# Patient Record
Sex: Male | Born: 1985 | Race: Black or African American | Hispanic: No | Marital: Married | Smoking: Never smoker
Health system: Southern US, Community
[De-identification: ages and names within clinical notes are randomized; demographics above are authoritative.]

## PROBLEM LIST (undated history)

## (undated) DIAGNOSIS — J302 Other seasonal allergic rhinitis: Secondary | ICD-10-CM

## (undated) HISTORY — PX: WISDOM TOOTH EXTRACTION: SHX21

---
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
## 2008-11-01 ENCOUNTER — Emergency Department (HOSPITAL_COMMUNITY): Admission: EM | Admit: 2008-11-01 | Discharge: 2008-11-01 | Payer: Self-pay | Admitting: Emergency Medicine

## 2010-08-24 LAB — HEPATITIS B SURFACE ANTIGEN: Hepatitis B Surface Ag: NEGATIVE

## 2010-08-24 LAB — HIV ANTIBODY (ROUTINE TESTING W REFLEX): HIV: NONREACTIVE

## 2010-08-24 LAB — HSV 1 ANTIBODY, IGG: HSV 1 Glycoprotein G Ab, IgG: 0.19 IV

## 2010-08-24 LAB — RPR: RPR Ser Ql: NONREACTIVE

## 2010-08-24 LAB — HSV 2 ANTIBODY, IGG: HSV 2 Glycoprotein G Ab, IgG: 0.31 IV

## 2014-09-07 ENCOUNTER — Emergency Department (INDEPENDENT_AMBULATORY_CARE_PROVIDER_SITE_OTHER)
Admission: EM | Admit: 2014-09-07 | Discharge: 2014-09-07 | Disposition: A | Discharge status: Home / Self Care | Payer: BC Managed Care – PPO | Source: Home / Self Care | Attending: Family Medicine | Admitting: Family Medicine

## 2014-09-07 ENCOUNTER — Encounter (HOSPITAL_COMMUNITY): Payer: Self-pay | Admitting: Emergency Medicine

## 2014-09-07 DIAGNOSIS — L089 Local infection of the skin and subcutaneous tissue, unspecified: Secondary | ICD-10-CM | POA: Diagnosis not present

## 2014-09-07 MED ORDER — SULFAMETHOXAZOLE-TRIMETHOPRIM 800-160 MG PO TABS: 2.0000 | ORAL_TABLET | Freq: Two times a day (BID) | ORAL | Status: DC

## 2014-09-07 NOTE — Discharge Instructions (Signed)
Thank you for coming in today. ° °Cellulitis °Cellulitis is an infection of the skin and the tissue beneath it. The infected area is usually red and tender. Cellulitis occurs most often in the arms and lower legs.  °CAUSES  °Cellulitis is caused by bacteria that enter the skin through cracks or cuts in the skin. The most common types of bacteria that cause cellulitis are staphylococci and streptococci. °SIGNS AND SYMPTOMS  °· Redness and warmth. °· Swelling. °· Tenderness or pain. °· Fever. °DIAGNOSIS  °Your health care provider can usually determine what is wrong based on a physical exam. Blood tests may also be done. °TREATMENT  °Treatment usually involves taking an antibiotic medicine. °HOME CARE INSTRUCTIONS  °· Take your antibiotic medicine as directed by your health care provider. Finish the antibiotic even if you start to feel better. °· Keep the infected arm or leg elevated to reduce swelling. °· Apply a warm cloth to the affected area up to 4 times per day to relieve pain. °· Take medicines only as directed by your health care provider. °· Keep all follow-up visits as directed by your health care provider. °SEEK MEDICAL CARE IF:  °· You notice red streaks coming from the infected area. °· Your red area gets larger or turns dark in color. °· Your bone or joint underneath the infected area becomes painful after the skin has healed. °· Your infection returns in the same area or another area. °· You notice a swollen bump in the infected area. °· You develop new symptoms. °· You have a fever. °SEEK IMMEDIATE MEDICAL CARE IF:  °· You feel very sleepy. °· You develop vomiting or diarrhea. °· You have a general ill feeling (malaise) with muscle aches and pains. °MAKE SURE YOU:  °· Understand these instructions. °· Will watch your condition. °· Will get help right away if you are not doing well or get worse. °Document Released: 02/10/2005 Document Revised: 09/17/2013 Document Reviewed: 07/19/2011 °[employer]® Patient  Information ©2015 [employer]. This information is not intended to replace advice given to you by your health care provider. Make sure you discuss any questions you have with your health care provider. ° °

## 2014-09-07 NOTE — ED Provider Notes (Signed)
Randall Clayton is a 29 y.o. male who presents to Urgent Care today for skin infection. Patient is a painful bump behind his right ear that has been present for the last week or so. She has tried to express pus which has not happened yet. Symptoms are mild. No fevers or chills vomiting or diarrhea. No treatments tried yet.   History reviewed. No pertinent past medical history. History reviewed. No pertinent past surgical history. History  Substance Use Topics  . Smoking status: Never Smoker   . Smokeless tobacco: Not on file  . Alcohol Use: No   ROS as above Medications: No current facility-administered medications for this encounter.   Current Outpatient Prescriptions  Medication Sig Dispense Refill  . sulfamethoxazole-trimethoprim (BACTRIM DS,SEPTRA DS) 800-160 MG per tablet Take 2 tablets by mouth 2 (two) times daily. 28 tablet 0   No Known Allergies   Exam:  BP 168/95 mmHg  Pulse 72  Temp(Src) 97.8 F (36.6 C) (Oral)  Resp 16  SpO2 100% Gen: Well NAD HEENT: EOMI,  MMM skin behind right ear with inflamed erythematous skin tag. No fluctuance or surrounding erythema Lungs: Normal work of breathing. CTABL Heart: RRR no MRG Abd: NABS, Soft. Nondistended, Nontender Exts: Brisk capillary refill, warm and well perfused.   No results found for this or any previous visit (from the past 24 hour(s)). No results found.  Assessment and Plan: 29 y.o. male with infected skin tag. Bactrim return as needed  Discussed warning signs or symptoms. Please see discharge instructions. Patient expresses understanding.     Rodolph BongEvan S Korrine Sicard, MD 09/07/14 1900

## 2014-09-07 NOTE — ED Notes (Signed)
Reports boil behind right ear x several years.  Mild swelling and pain.

## 2017-04-12 ENCOUNTER — Encounter (HOSPITAL_COMMUNITY): Payer: Self-pay | Admitting: Emergency Medicine

## 2017-04-12 ENCOUNTER — Other Ambulatory Visit: Payer: Self-pay

## 2017-04-12 ENCOUNTER — Ambulatory Visit (HOSPITAL_COMMUNITY)
Admission: EM | Admit: 2017-04-12 | Discharge: 2017-04-12 | Disposition: A | Discharge status: Home / Self Care | Payer: BC Managed Care – PPO | Attending: Family Medicine | Admitting: Family Medicine

## 2017-04-12 DIAGNOSIS — Z113 Encounter for screening for infections with a predominantly sexual mode of transmission: Secondary | ICD-10-CM

## 2017-04-12 DIAGNOSIS — Z202 Contact with and (suspected) exposure to infections with a predominantly sexual mode of transmission: Secondary | ICD-10-CM | POA: Insufficient documentation
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
## 2017-04-13 LAB — URINE CYTOLOGY ANCILLARY ONLY
CHLAMYDIA, DNA PROBE: NEGATIVE
Neisseria Gonorrhea: NEGATIVE
TRICH (WINDOWPATH): NEGATIVE
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
## 2019-12-27 ENCOUNTER — Other Ambulatory Visit: Payer: Self-pay

## 2019-12-27 DIAGNOSIS — R109 Unspecified abdominal pain: Secondary | ICD-10-CM | POA: Diagnosis not present

## 2019-12-27 DIAGNOSIS — S6991XA Unspecified injury of right wrist, hand and finger(s), initial encounter: Secondary | ICD-10-CM | POA: Diagnosis present

## 2019-12-27 DIAGNOSIS — Y998 Other external cause status: Secondary | ICD-10-CM | POA: Diagnosis not present

## 2019-12-27 DIAGNOSIS — S52571A Other intraarticular fracture of lower end of right radius, initial encounter for closed fracture: Secondary | ICD-10-CM | POA: Insufficient documentation

## 2019-12-27 DIAGNOSIS — Y92481 Parking lot as the place of occurrence of the external cause: Secondary | ICD-10-CM | POA: Insufficient documentation

## 2019-12-27 DIAGNOSIS — S62322A Displaced fracture of shaft of third metacarpal bone, right hand, initial encounter for closed fracture: Secondary | ICD-10-CM | POA: Diagnosis not present

## 2019-12-27 DIAGNOSIS — S62336A Displaced fracture of neck of fifth metacarpal bone, right hand, initial encounter for closed fracture: Secondary | ICD-10-CM | POA: Insufficient documentation

## 2019-12-27 DIAGNOSIS — Y9389 Activity, other specified: Secondary | ICD-10-CM | POA: Insufficient documentation

## 2019-12-28 ENCOUNTER — Emergency Department (HOSPITAL_COMMUNITY): Payer: BC Managed Care – PPO

## 2019-12-28 ENCOUNTER — Encounter (HOSPITAL_COMMUNITY): Payer: Self-pay | Admitting: Emergency Medicine

## 2019-12-28 ENCOUNTER — Emergency Department (HOSPITAL_COMMUNITY)
Admission: EM | Admit: 2019-12-28 | Discharge: 2019-12-28 | Disposition: A | Discharge status: Home / Self Care | Payer: BC Managed Care – PPO | Attending: Emergency Medicine | Admitting: Emergency Medicine

## 2019-12-28 DIAGNOSIS — S52571A Other intraarticular fracture of lower end of right radius, initial encounter for closed fracture: Secondary | ICD-10-CM

## 2019-12-28 DIAGNOSIS — S62336A Displaced fracture of neck of fifth metacarpal bone, right hand, initial encounter for closed fracture: Secondary | ICD-10-CM

## 2019-12-28 DIAGNOSIS — S62322A Displaced fracture of shaft of third metacarpal bone, right hand, initial encounter for closed fracture: Secondary | ICD-10-CM

## 2019-12-28 MED ORDER — SODIUM CHLORIDE (PF) 0.9 % IJ SOLN
INTRAMUSCULAR | Status: AC
  Filled 2019-12-28: qty 50

## 2019-12-28 MED ORDER — ACETAMINOPHEN 500 MG PO TABS
1000.0000 mg | ORAL_TABLET | ORAL | Status: AC
  Administered 2019-12-28: 1000 mg via ORAL
  Filled 2019-12-28: qty 2

## 2019-12-28 MED ORDER — OXYCODONE-ACETAMINOPHEN 5-325 MG PO TABS: 2.0000 | ORAL_TABLET | Freq: Once | ORAL | Status: DC

## 2019-12-28 MED ORDER — OXYCODONE-ACETAMINOPHEN 5-325 MG PO TABS: 1.0000 | ORAL_TABLET | ORAL | 0 refills | Status: DC | PRN

## 2019-12-28 MED ORDER — IOHEXOL 300 MG/ML  SOLN: 100.0000 mL | Freq: Once | INTRAMUSCULAR | Status: DC | PRN

## 2019-12-28 NOTE — Discharge Instructions (Signed)
Take the pain medication as prescribed and follow-up with a hand doctor. Keep elevated. Apply ice. Do not apply any weight to the right arm. Return to the ED with worsening pain, numbness, tingling, weakness, any other concerns.
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
## 2019-12-28 NOTE — Progress Notes (Signed)
Orthopedic Tech Progress Note Patient Details:  [facility] 09-Mar-1986 [id number]  Ortho Devices Type of Ortho Device: Ulna gutter splint Ortho Device/Splint Location: RUE Ortho Device/Splint Interventions: Application   Post Interventions Patient Tolerated: Fair Instructions Provided: Care of device   [facility] E [facility] 12/28/2019, 6:55 AM
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
## 2019-12-31 ENCOUNTER — Other Ambulatory Visit: Payer: Self-pay

## 2019-12-31 ENCOUNTER — Encounter (HOSPITAL_BASED_OUTPATIENT_CLINIC_OR_DEPARTMENT_OTHER): Payer: Self-pay | Admitting: Orthopaedic Surgery

## 2020-01-01 ENCOUNTER — Other Ambulatory Visit (HOSPITAL_COMMUNITY)
Admission: RE | Admit: 2020-01-01 | Discharge: 2020-01-01 | Disposition: A | Discharge status: Home / Self Care | Payer: BC Managed Care – PPO | Source: Ambulatory Visit | Attending: Orthopaedic Surgery | Admitting: Orthopaedic Surgery

## 2020-01-01 DIAGNOSIS — U071 COVID-19: Secondary | ICD-10-CM | POA: Diagnosis not present

## 2020-01-01 DIAGNOSIS — Z01812 Encounter for preprocedural laboratory examination: Secondary | ICD-10-CM | POA: Diagnosis present

## 2020-01-01 LAB — SARS CORONAVIRUS 2 (TAT 6-24 HRS): SARS Coronavirus 2: POSITIVE — AB

## 2020-01-02 NOTE — Progress Notes (Signed)
Called Erica from Dr. Hinda Glatter office with + covid results for this pt. The pt is to have surgery at the [facility] on Thurs 01/03/20 at 0730. These are the current guidelines:  Positive Results for:  Asymptomatic: Procedure postponed and quarantine for 10 days. Symptomatic: Procedure postponed and quarantine for 14 days. Immunocompromised: Procedure postponed and quarantine for 20 days. Hospitalized with Covid: postponed and quarantine for 21 days.  The pt will not be retested for 90 days from the + result.

## 2020-01-02 NOTE — Progress Notes (Signed)
TC to patient. Patient instructed to arrive at 0600, stay in vehicle and call [phone] and ask for Ginger. She will go to the car and bring patient directly back to the short stay bay. Patient states he has only had allergy like symptoms.

## 2020-01-03 ENCOUNTER — Ambulatory Visit (HOSPITAL_COMMUNITY)
Admission: RE | Admit: 2020-01-03 | Discharge: 2020-01-03 | Disposition: A | Discharge status: Home / Self Care | Payer: BC Managed Care – PPO | Attending: Orthopaedic Surgery | Admitting: Orthopaedic Surgery

## 2020-01-03 ENCOUNTER — Ambulatory Visit (HOSPITAL_COMMUNITY): Payer: BC Managed Care – PPO | Admitting: Certified Registered Nurse Anesthetist

## 2020-01-03 ENCOUNTER — Other Ambulatory Visit: Payer: Self-pay

## 2020-01-03 ENCOUNTER — Ambulatory Visit (HOSPITAL_COMMUNITY): Payer: BC Managed Care – PPO

## 2020-01-03 ENCOUNTER — Encounter (HOSPITAL_COMMUNITY)
Admission: RE | Disposition: A | Discharge status: Home / Self Care | Payer: Self-pay | Source: Home / Self Care | Attending: Orthopaedic Surgery

## 2020-01-03 ENCOUNTER — Encounter (HOSPITAL_COMMUNITY): Payer: Self-pay | Admitting: Orthopaedic Surgery

## 2020-01-03 DIAGNOSIS — S62322A Displaced fracture of shaft of third metacarpal bone, right hand, initial encounter for closed fracture: Secondary | ICD-10-CM | POA: Diagnosis present

## 2020-01-03 DIAGNOSIS — S52511A Displaced fracture of right radial styloid process, initial encounter for closed fracture: Secondary | ICD-10-CM | POA: Insufficient documentation

## 2020-01-03 DIAGNOSIS — S62396A Other fracture of fifth metacarpal bone, right hand, initial encounter for closed fracture: Secondary | ICD-10-CM | POA: Diagnosis not present

## 2020-01-03 DIAGNOSIS — U071 COVID-19: Secondary | ICD-10-CM | POA: Diagnosis not present

## 2020-01-03 DIAGNOSIS — Z79899 Other long term (current) drug therapy: Secondary | ICD-10-CM | POA: Diagnosis not present

## 2020-01-03 HISTORY — DX: Other seasonal allergic rhinitis: J30.2

## 2020-01-03 HISTORY — PX: ORIF WRIST FRACTURE: SHX2133

## 2020-01-03 LAB — POCT I-STAT, CHEM 8
BUN: 15 mg/dL (ref 6–20)
Calcium, Ion: 1.14 mmol/L — ABNORMAL LOW (ref 1.15–1.40)
Chloride: 105 mmol/L (ref 98–111)
Creatinine, Ser: 0.9 mg/dL (ref 0.61–1.24)
Glucose, Bld: 104 mg/dL — ABNORMAL HIGH (ref 70–99)
HCT: 47 % (ref 39.0–52.0)
Hemoglobin: 16 g/dL (ref 13.0–17.0)
Potassium: 4 mmol/L (ref 3.5–5.1)
Sodium: 142 mmol/L (ref 135–145)
TCO2: 25 mmol/L (ref 22–32)

## 2020-01-03 LAB — MRSA PCR SCREENING: MRSA by PCR: NEGATIVE

## 2020-01-03 SURGERY — OPEN REDUCTION INTERNAL FIXATION (ORIF) WRIST FRACTURE
Anesthesia: General | Site: Wrist | Laterality: Right

## 2020-01-03 MED ORDER — PROPOFOL 10 MG/ML IV BOLUS
INTRAVENOUS | Status: DC | PRN
  Administered 2020-01-03: 180 mg via INTRAVENOUS

## 2020-01-03 MED ORDER — OXYCODONE HCL 5 MG/5ML PO SOLN: 5.0000 mg | Freq: Once | ORAL | Status: DC | PRN

## 2020-01-03 MED ORDER — LACTATED RINGERS IV SOLN: INTRAVENOUS | Status: DC

## 2020-01-03 MED ORDER — SUCCINYLCHOLINE CHLORIDE 20 MG/ML IJ SOLN
INTRAMUSCULAR | Status: DC | PRN
  Administered 2020-01-03: 180 mg via INTRAVENOUS

## 2020-01-03 MED ORDER — PHENYLEPHRINE HCL-NACL 10-0.9 MG/250ML-% IV SOLN
INTRAVENOUS | Status: DC | PRN
  Administered 2020-01-03: 25 ug/min via INTRAVENOUS

## 2020-01-03 MED ORDER — DEXMEDETOMIDINE (PRECEDEX) IN NS 20 MCG/5ML (4 MCG/ML) IV SYRINGE
PREFILLED_SYRINGE | INTRAVENOUS | Status: DC | PRN
  Administered 2020-01-03: 4 ug via INTRAVENOUS
  Administered 2020-01-03: 8 ug via INTRAVENOUS
  Administered 2020-01-03: 20 ug via INTRAVENOUS
  Administered 2020-01-03: 8 ug via INTRAVENOUS

## 2020-01-03 MED ORDER — MIDAZOLAM HCL 2 MG/2ML IJ SOLN
INTRAMUSCULAR | Status: AC
  Filled 2020-01-03: qty 2

## 2020-01-03 MED ORDER — FENTANYL CITRATE (PF) 250 MCG/5ML IJ SOLN
INTRAMUSCULAR | Status: AC
  Filled 2020-01-03: qty 5

## 2020-01-03 MED ORDER — CHLORHEXIDINE GLUCONATE 4 % EX LIQD: 60.0000 mL | Freq: Once | CUTANEOUS | Status: DC

## 2020-01-03 MED ORDER — LACTATED RINGERS IV SOLN: INTRAVENOUS | Status: DC | PRN

## 2020-01-03 MED ORDER — 0.9 % SODIUM CHLORIDE (POUR BTL) OPTIME
TOPICAL | Status: DC | PRN
  Administered 2020-01-03: 1000 mL

## 2020-01-03 MED ORDER — ONDANSETRON HCL 4 MG/2ML IJ SOLN
INTRAMUSCULAR | Status: DC | PRN
  Administered 2020-01-03: 4 mg via INTRAVENOUS

## 2020-01-03 MED ORDER — FENTANYL CITRATE (PF) 100 MCG/2ML IJ SOLN: 25.0000 ug | INTRAMUSCULAR | Status: DC | PRN

## 2020-01-03 MED ORDER — CEFAZOLIN SODIUM-DEXTROSE 2-4 GM/100ML-% IV SOLN
2.0000 g | INTRAVENOUS | Status: AC
  Administered 2020-01-03: 2 g via INTRAVENOUS

## 2020-01-03 MED ORDER — FENTANYL CITRATE (PF) 250 MCG/5ML IJ SOLN
INTRAMUSCULAR | Status: DC | PRN
  Administered 2020-01-03: 150 ug via INTRAVENOUS

## 2020-01-03 MED ORDER — BUPIVACAINE HCL (PF) 0.25 % IJ SOLN
INTRAMUSCULAR | Status: AC
  Filled 2020-01-03: qty 30

## 2020-01-03 MED ORDER — OXYCODONE HCL 5 MG PO TABS: 5.0000 mg | ORAL_TABLET | Freq: Once | ORAL | Status: DC | PRN

## 2020-01-03 MED ORDER — LIDOCAINE HCL (CARDIAC) PF 100 MG/5ML IV SOSY
PREFILLED_SYRINGE | INTRAVENOUS | Status: DC | PRN
  Administered 2020-01-03: 50 mg via INTRAVENOUS

## 2020-01-03 MED ORDER — PROPOFOL 500 MG/50ML IV EMUL
INTRAVENOUS | Status: DC | PRN
  Administered 2020-01-03: 50 ug/kg/min via INTRAVENOUS

## 2020-01-03 MED ORDER — PROPOFOL 10 MG/ML IV BOLUS
INTRAVENOUS | Status: AC
  Filled 2020-01-03: qty 40

## 2020-01-03 MED ORDER — PHENYLEPHRINE HCL (PRESSORS) 10 MG/ML IV SOLN
INTRAVENOUS | Status: DC | PRN
  Administered 2020-01-03: 120 ug via INTRAVENOUS
  Administered 2020-01-03: 80 ug via INTRAVENOUS

## 2020-01-03 MED ORDER — POVIDONE-IODINE 10 % EX SWAB: 2.0000 "application " | Freq: Once | CUTANEOUS | Status: DC

## 2020-01-03 MED ORDER — MIDAZOLAM HCL 5 MG/5ML IJ SOLN
INTRAMUSCULAR | Status: DC | PRN
  Administered 2020-01-03: 2 mg via INTRAVENOUS
  Administered 2020-01-03: 1 mg via INTRAVENOUS

## 2020-01-03 MED ORDER — ONDANSETRON HCL 4 MG/2ML IJ SOLN: 4.0000 mg | Freq: Once | INTRAMUSCULAR | Status: DC | PRN

## 2020-01-03 SURGICAL SUPPLY — 53 items
ALCOHOL 70% 16 OZ (MISCELLANEOUS) IMPLANT
BIT DRILL LNG QR ACUTRAK 2 (BIT) ×3 IMPLANT
BNDG ELASTIC 3X5.8 VLCR STR LF (GAUZE/BANDAGES/DRESSINGS) ×3 IMPLANT
BNDG ELASTIC 4X5.8 VLCR STR LF (GAUZE/BANDAGES/DRESSINGS) ×3 IMPLANT
BNDG ESMARK 4X9 LF (GAUZE/BANDAGES/DRESSINGS) ×3 IMPLANT
BNDG GAUZE ELAST 4 BULKY (GAUZE/BANDAGES/DRESSINGS) IMPLANT
CANISTER SUCT 3000ML PPV (MISCELLANEOUS) ×3 IMPLANT
CAP PIN PROTECTOR ORTHO WHT (CAP) ×3 IMPLANT
CHLORAPREP W/TINT 26 (MISCELLANEOUS) ×3 IMPLANT
CORD BIPOLAR FORCEPS 12FT (ELECTRODE) ×3 IMPLANT
COVER SURGICAL LIGHT HANDLE (MISCELLANEOUS) ×3 IMPLANT
COVER WAND RF STERILE (DRAPES) IMPLANT
CUFF TOURN SGL QUICK 18X4 (TOURNIQUET CUFF) ×3 IMPLANT
CUFF TOURN SGL QUICK 24 (TOURNIQUET CUFF)
CUFF TRNQT CYL 24X4X16.5-23 (TOURNIQUET CUFF) IMPLANT
DRAPE OEC MINIVIEW 54X84 (DRAPES) ×3 IMPLANT
DRAPE SURG 17X23 STRL (DRAPES) ×3 IMPLANT
DRSG XEROFORM 1X8 (GAUZE/BANDAGES/DRESSINGS) ×3 IMPLANT
GAUZE SPONGE 4X4 12PLY STRL (GAUZE/BANDAGES/DRESSINGS) IMPLANT
GAUZE SPONGE 4X4 12PLY STRL LF (GAUZE/BANDAGES/DRESSINGS) ×3 IMPLANT
GAUZE XEROFORM 1X8 LF (GAUZE/BANDAGES/DRESSINGS) IMPLANT
GLOVE INDICATOR 8.0 STRL GRN (GLOVE) ×3 IMPLANT
GLOVE SURG SYN 7.5  E (GLOVE) ×3
GLOVE SURG SYN 7.5 E (GLOVE) ×1 IMPLANT
GOWN STRL REUS W/ TWL LRG LVL3 (GOWN DISPOSABLE) ×3 IMPLANT
GOWN STRL REUS W/TWL LRG LVL3 (GOWN DISPOSABLE) ×9
GUIDEWIRE 0.054X7 ×12 IMPLANT
GUIDEWIRE ORTHO 0.054X7 SS (WIRE) ×12 IMPLANT
KIT BASIN OR (CUSTOM PROCEDURE TRAY) ×3 IMPLANT
KIT INNATE INSTRUMENT FOR 4.5 (INSTRUMENTS) ×3 IMPLANT
KIT TURNOVER KIT B (KITS) ×3 IMPLANT
NAIL IM THRD INNATE 4.5X50 (Nail) ×3 IMPLANT
NEEDLE 22X1 1/2 (OR ONLY) (NEEDLE) IMPLANT
NS IRRIG 1000ML POUR BTL (IV SOLUTION) ×3 IMPLANT
PACK ORTHO EXTREMITY (CUSTOM PROCEDURE TRAY) ×3 IMPLANT
PAD ARMBOARD 7.5X6 YLW CONV (MISCELLANEOUS) ×6 IMPLANT
PAD CAST 3X4 CTTN HI CHSV (CAST SUPPLIES) ×1 IMPLANT
PAD CAST 4YDX4 CTTN HI CHSV (CAST SUPPLIES) ×1 IMPLANT
PADDING CAST COTTON 3X4 STRL (CAST SUPPLIES) ×3
PADDING CAST COTTON 4X4 STRL (CAST SUPPLIES) ×3
SCREW ACUTRAK 2 STD 30MM (Screw) ×3 IMPLANT
SLING ARM FOAM STRAP XLG (SOFTGOODS) ×3 IMPLANT
SOL PREP POV-IOD 4OZ 10% (MISCELLANEOUS) ×3 IMPLANT
SOL PREP PROV IODINE SCRUB 4OZ (MISCELLANEOUS) ×3 IMPLANT
SPLINT FIBERGLASS 4X15 (CAST SUPPLIES) ×6 IMPLANT
SUT VIC AB 3-0 PS2 18 (SUTURE) IMPLANT
SYR CONTROL 10ML LL (SYRINGE) IMPLANT
TOWEL GREEN STERILE (TOWEL DISPOSABLE) ×3 IMPLANT
TOWEL GREEN STERILE FF (TOWEL DISPOSABLE) ×3 IMPLANT
TUBE CONNECTING 12'X1/4 (SUCTIONS) ×1
TUBE CONNECTING 12X1/4 (SUCTIONS) ×2 IMPLANT
UNDERPAD 30X36 HEAVY ABSORB (UNDERPADS AND DIAPERS) ×3 IMPLANT
WATER STERILE IRR 1000ML POUR (IV SOLUTION) ×3 IMPLANT

## 2020-01-03 NOTE — Progress Notes (Signed)
Orthopedic Tech Progress Note Patient Details:  [facility] 12/20/1985 [id number] Dropped off sling at front desk of PACU Ortho Devices Type of Ortho Device: Sling immobilizer Ortho Device/Splint Interventions: Ordered       [name] 01/03/2020, 10:27 AM
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
## 2020-01-03 NOTE — Transfer of Care (Signed)
Immediate Anesthesia Transfer of Care Note  Patient: Randall Clayton  Procedure(s) Performed: Right distal radius fracture open reduction and internal fixation, third metacarpal open reduction and internal fixation, fifth metacarpal head open reduction and internal fixation and surgery as indicated (Right Wrist)  Patient Location: OR 6  Anesthesia Type:General  Level of Consciousness: awake, alert , patient cooperative and responds to stimulation  Airway & Oxygen Therapy: Patient Spontanous Breathing and Patient connected to face mask oxygen  Post-op Assessment: Report given to RN, Post -op Vital signs reviewed and stable and Patient moving all extremities X 4  Post vital signs: Reviewed and stable  Last Vitals:  Vitals Value Taken Time  BP 143/78 01/03/20 1011  Temp    Pulse 103 01/03/20 1021  Resp    SpO2 98 % 01/03/20 1021  Vitals shown include unvalidated device data.  Last Pain:  Vitals:   01/03/20 1004  TempSrc:   PainSc: Asleep      Patients Stated Pain Goal: 2 (01/03/20 0645)  Complications: No complications documented.

## 2020-01-03 NOTE — Anesthesia Procedure Notes (Deleted)
Anesthesia Procedure Image    
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
## 2020-01-03 NOTE — Anesthesia Procedure Notes (Signed)
Performed by: Keegan Bensch [place]ef[place], CRNA       

## 2020-01-03 NOTE — Anesthesia Procedure Notes (Signed)
Anesthesia Regional Block: Supraclavicular block   Pre-Anesthetic Checklist: ,, timeout performed, Correct Patient, Correct Site, Correct Laterality, Correct Procedure, Correct Position, site marked, Risks and benefits discussed,  Surgical consent,  Pre-op evaluation,  At surgeon's request and post-op pain management  Laterality: Right  Prep: chloraprep       Needles:  Injection technique: Single-shot  Needle Type: Stimulator Needle - 80          Additional Needles:   Procedures:, nerve stimulator,,,,,,,  Narrative:  Start time: 01/03/2020 7:10 AM End time: 01/03/2020 7:20 AM Injection made incrementally with aspirations every 5 mL.  Performed by: Personally  Anesthesiologist: Kipp Brood, MD  Additional Notes: 30 cc 0.75% ropivacaine

## 2020-01-03 NOTE — Anesthesia Procedure Notes (Signed)
Procedure Name: Intubation Date/Time: 01/03/2020 8:15 AM Performed by: Kipp Brood, MD Pre-anesthesia Checklist: Patient identified, Emergency Drugs available, Suction available and Patient being monitored Patient Re-evaluated:Patient Re-evaluated prior to induction Oxygen Delivery Method: Circle system utilized and Simple face mask Preoxygenation: Pre-oxygenation with 100% oxygen Induction Type: IV induction Laryngoscope Size: Glidescope Grade View: Grade I Tube type: Oral Tube size: 8.0 mm Number of attempts: 1 Airway Equipment and Method: Stylet and Oral airway Placement Confirmation: ETT inserted through vocal cords under direct vision,  positive ETCO2 and breath sounds checked- equal and bilateral Secured at: 21 cm Tube secured with: Tape Dental Injury: Teeth and Oropharynx as per pre-operative assessment

## 2020-01-03 NOTE — Anesthesia Preprocedure Evaluation (Signed)
Anesthesia Evaluation  Patient identified by MRN, date of birth, ID band Patient awake    Reviewed: Allergy & Precautions, NPO status , Patient's Chart, lab work & pertinent test results  Airway Mallampati: III  TM Distance: >3 FB Neck ROM: Full    Dental  (+) Teeth Intact   Pulmonary    breath sounds clear to auscultation       Cardiovascular  Rhythm:Regular Rate:Normal     Neuro/Psych    GI/Hepatic   Endo/Other    Renal/GU      Musculoskeletal   Abdominal   Peds  Hematology   Anesthesia Other Findings   Reproductive/Obstetrics                             Anesthesia Physical Anesthesia Plan  ASA: II  Anesthesia Plan: MAC and Regional   Post-op Pain Management:    Induction: Intravenous  PONV Risk Score and Plan: Ondansetron and Dexamethasone  Airway Management Planned: Oral ETT  Additional Equipment:   Intra-op Plan:   Post-operative Plan: Extubation in OR  Informed Consent: I have reviewed the patients History and Physical, chart, labs and discussed the procedure including the risks, benefits and alternatives for the proposed anesthesia with the patient or authorized representative who has indicated his/her understanding and acceptance.     Dental advisory given  Plan Discussed with: CRNA and Anesthesiologist  Anesthesia Plan Comments:         Anesthesia Quick Evaluation

## 2020-01-03 NOTE — Discharge Instructions (Signed)
General Anesthesia, Adult, Care After This sheet gives you information about how to care for yourself after your procedure. Your health care provider may also give you more specific instructions. If you have problems or questions, contact your health care provider. What can I expect after the procedure? After the procedure, the following side effects are common:  Pain or discomfort at the IV site.  Nausea.  Vomiting.  Sore throat.  Trouble concentrating.  Feeling cold or chills.  Weak or tired.  Sleepiness and fatigue.  Soreness and body aches. These side effects can affect parts of the body that were not involved in surgery. Follow these instructions at home:  For at least 24 hours after the procedure:  Have a responsible adult stay with you. It is important to have someone help care for you until you are awake and alert.  Rest as needed.  Do not: ? Participate in activities in which you could fall or become injured. ? Drive. ? Use heavy machinery. ? Drink alcohol. ? Take sleeping pills or medicines that cause drowsiness. ? Make important decisions or sign legal documents. ? Take care of children on your own. Eating and drinking  Follow any instructions from your health care provider about eating or drinking restrictions.  When you feel hungry, start by eating small amounts of foods that are soft and easy to digest (bland), such as toast. Gradually return to your regular diet.  Drink enough fluid to keep your urine pale yellow.  If you vomit, rehydrate by drinking water, juice, or clear broth. General instructions  If you have sleep apnea, surgery and certain medicines can increase your risk for breathing problems. Follow instructions from your health care provider about wearing your sleep device: ? Anytime you are sleeping, including during daytime naps. ? While taking prescription pain medicines, sleeping medicines, or medicines that make you drowsy.  Return to  your normal activities as told by your health care provider. Ask your health care provider what activities are safe for you.  Take over-the-counter and prescription medicines only as told by your health care provider.  If you smoke, do not smoke without supervision.  Keep all follow-up visits as told by your health care provider. This is important. Contact a health care provider if:  You have nausea or vomiting that does not get better with medicine.  You cannot eat or drink without vomiting.  You have pain that does not get better with medicine.  You are unable to pass urine.  You develop a skin rash.  You have a fever.  You have redness around your IV site that gets worse. Get help right away if:  You have difficulty breathing.  You have chest pain.  You have blood in your urine or stool, or you vomit blood. Summary  After the procedure, it is common to have a sore throat or nausea. It is also common to feel tired.  Have a responsible adult stay with you for the first 24 hours after general anesthesia. It is important to have someone help care for you until you are awake and alert.  When you feel hungry, start by eating small amounts of foods that are soft and easy to digest (bland), such as toast. Gradually return to your regular diet.  Drink enough fluid to keep your urine pale yellow.  Return to your normal activities as told by your health care provider. Ask your health care provider what activities are safe for you. This information is not   intended to replace advice given to you by your health care provider. Make sure you discuss any questions you have with your health care provider. Document Revised: 05/06/2017 Document Reviewed: 12/17/2016 Elsevier Patient Education  2020 [employer]. Discharge Instructions  - Keep dressings in place. Do not remove them. - The dressings must stay dry - Take all medication as prescribed. Transition to over the counter pain  medication as your pain improves - Keep the hand elevated over the next 48-72 hours to help with pain and swelling - Move all digits not restricted by the dressings regularly to prevent stiffness - Please call to schedule a follow up appointment with Dr. Roney Mans and therapy at [phone] for 5-7 days following surgery - Your pain medication were sent to your pharmacy yesterday.

## 2020-01-03 NOTE — Anesthesia Postprocedure Evaluation (Signed)
Anesthesia Post Note  Patient: Randall Clayton  Procedure(s) Performed: Right distal radius fracture open reduction and internal fixation, third metacarpal open reduction and internal fixation, fifth metacarpal head open reduction and internal fixation and surgery as indicated (Right Wrist)     Patient location during evaluation: Other Anesthesia Type: General Level of consciousness: awake and alert Pain management: pain level controlled Vital Signs Assessment: post-procedure vital signs reviewed and stable Respiratory status: spontaneous breathing, nonlabored ventilation, respiratory function stable and patient connected to nasal cannula oxygen Cardiovascular status: blood pressure returned to baseline and stable Postop Assessment: no apparent nausea or vomiting Anesthetic complications: no   No complications documented.  Last Vitals:  Vitals:   01/03/20 1026 01/03/20 1029  BP: (!) 153/92   Pulse: 98 99  Resp:    Temp:  36.6 C  SpO2: 98% 99%    Last Pain:  Vitals:   01/03/20 1015  TempSrc:   PainSc: 0-No pain                 Markee Remlinger COKER

## 2020-01-04 ENCOUNTER — Encounter (HOSPITAL_COMMUNITY): Payer: Self-pay | Admitting: Orthopaedic Surgery
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
## 2022-07-05 ENCOUNTER — Ambulatory Visit (HOSPITAL_COMMUNITY)
Admission: EM | Admit: 2022-07-05 | Discharge: 2022-07-05 | Disposition: A | Discharge status: Home / Self Care | Payer: Self-pay | Attending: Emergency Medicine | Admitting: Emergency Medicine

## 2022-07-05 ENCOUNTER — Ambulatory Visit (INDEPENDENT_AMBULATORY_CARE_PROVIDER_SITE_OTHER): Payer: Self-pay

## 2022-07-05 ENCOUNTER — Encounter (HOSPITAL_COMMUNITY): Payer: Self-pay | Admitting: Emergency Medicine

## 2022-07-05 ENCOUNTER — Other Ambulatory Visit: Payer: Self-pay

## 2022-07-05 DIAGNOSIS — M25531 Pain in right wrist: Secondary | ICD-10-CM

## 2022-07-05 MED ORDER — ACETAMINOPHEN 325 MG PO TABS
650.0000 mg | ORAL_TABLET | Freq: Once | ORAL | Status: AC
  Administered 2022-07-05: 650 mg via ORAL

## 2022-07-05 MED ORDER — ACETAMINOPHEN 325 MG PO TABS
ORAL_TABLET | ORAL | Status: AC
  Filled 2022-07-05: qty 2
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
## 2022-07-05 NOTE — Discharge Instructions (Addendum)
I recommend to use ibuprofen or tylenol for pain management.  Apply ice for 20 minutes at a time, a few times daily Elevate the arm to reduce swelling  Please follow with ortho if symptoms persist or worsen
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
